# Patient Record
Sex: Male | Born: 1990 | Race: Black or African American | Hispanic: No | Marital: Single | Smoking: Never smoker
Health system: Southern US, Community
[De-identification: ages and names within clinical notes are randomized; demographics above are authoritative.]

---

## 2015-07-14 ENCOUNTER — Encounter (HOSPITAL_COMMUNITY): Payer: Self-pay | Admitting: Emergency Medicine

## 2015-07-14 ENCOUNTER — Emergency Department (INDEPENDENT_AMBULATORY_CARE_PROVIDER_SITE_OTHER)
Admission: EM | Admit: 2015-07-14 | Discharge: 2015-07-14 | Disposition: A | Discharge status: Home / Self Care | Payer: Self-pay | Source: Home / Self Care | Attending: Family Medicine | Admitting: Family Medicine

## 2015-07-14 DIAGNOSIS — R000 Tachycardia, unspecified: Secondary | ICD-10-CM

## 2015-07-14 NOTE — ED Notes (Signed)
Patient attempted to donate plasma at [facility].  Patient was refused due to heart rate being elevated.  Instructed to have tachycardia evaluated.

## 2015-07-14 NOTE — Discharge Instructions (Signed)
No more caffeine or energy drinks, drink plenty of water or gatorade. See your doctor as needed.

## 2015-07-14 NOTE — ED Notes (Signed)
Dr Artis Flock completed paperwork for [facility]-1 page, copied to be scanned into medical record

## 2015-07-14 NOTE — ED Provider Notes (Signed)
CSN: [id number]     Arrival date & time 07/14/15  1508 History   First MD Initiated Contact with Patient 07/14/15 1532     Chief Complaint  Patient presents with  . Tachycardia   (Consider location/radiation/quality/duration/timing/severity/associated sxs/prior Treatment) Patient is a 24 y.o. male presenting with palpitations. The history is provided by the patient.  Palpitations Palpitations quality:  Fast Onset quality:  Gradual Timing:  Intermittent Progression:  Resolved Chronicity:  New Context: anxiety and caffeine   Context: not nicotine and not stimulant use   Context comment:  Found to have tach at [facility], pt reports anxiety. Associated symptoms: no chest pain and no shortness of breath     History reviewed. No pertinent past medical history. History reviewed. No pertinent past surgical history. No family history on file. Social History  Substance Use Topics  . Smoking status: Never Smoker   . Smokeless tobacco: None  . Alcohol Use: None    Review of Systems  Constitutional: Negative.   HENT: Negative.   Respiratory: Negative.  Negative for chest tightness and shortness of breath.   Cardiovascular: Positive for palpitations. Negative for chest pain and leg swelling.  Gastrointestinal: Negative.   Neurological: Negative.     Allergies  Review of patient's allergies indicates no known allergies.  Home Medications   Prior to Admission medications   Not on File   Meds Ordered and Administered this Visit  Medications - No data to display  BP 147/75 mmHg  Pulse 73  Temp(Src) 98.7 F (37.1 C) (Oral)  Resp 18  SpO2 99% No data found.   Physical Exam  Constitutional: He is oriented to person, place, and time. He appears well-developed and well-nourished. No distress.  Neck: Normal range of motion. Neck supple. No thyromegaly present.  Cardiovascular: Normal rate, normal heart sounds and intact distal pulses.   Pulmonary/Chest: Effort normal and  breath sounds normal.  Neurological: He is alert and oriented to person, place, and time.  Skin: Skin is warm and dry.  Nursing note and vitals reviewed.   ED Course  Procedures (including critical care time)  Labs Review Labs Reviewed - No data to display  Imaging Review No results found.   Visual Acuity Review  Right Eye Distance:   Left Eye Distance:   Bilateral Distance:    Right Eye Near:   Left Eye Near:    Bilateral Near:         MDM   1. Tachycardia        Linna Hoff, MD 07/15/15 2045

## 2016-12-19 ENCOUNTER — Other Ambulatory Visit: Payer: Self-pay

## 2016-12-19 ENCOUNTER — Encounter (HOSPITAL_COMMUNITY): Payer: Self-pay | Admitting: Emergency Medicine

## 2016-12-19 ENCOUNTER — Emergency Department (HOSPITAL_COMMUNITY)
Admission: EM | Admit: 2016-12-19 | Discharge: 2016-12-20 | Disposition: A | Discharge status: Home / Self Care | Payer: Self-pay | Attending: Emergency Medicine | Admitting: Emergency Medicine

## 2016-12-19 DIAGNOSIS — T50905A Adverse effect of unspecified drugs, medicaments and biological substances, initial encounter: Secondary | ICD-10-CM

## 2016-12-19 DIAGNOSIS — Y829 Unspecified medical devices associated with adverse incidents: Secondary | ICD-10-CM | POA: Insufficient documentation

## 2016-12-19 DIAGNOSIS — R079 Chest pain, unspecified: Secondary | ICD-10-CM | POA: Insufficient documentation

## 2016-12-19 DIAGNOSIS — T407X5A Adverse effect of cannabis (derivatives), initial encounter: Secondary | ICD-10-CM | POA: Insufficient documentation

## 2016-12-19 DIAGNOSIS — T887XXA Unspecified adverse effect of drug or medicament, initial encounter: Secondary | ICD-10-CM | POA: Insufficient documentation

## 2016-12-19 LAB — I-STAT TROPONIN, ED: TROPONIN I, POC: 0 ng/mL (ref 0.00–0.08)

## 2016-12-19 LAB — CBC
HCT: 49.5 % (ref 39.0–52.0)
Hemoglobin: 16.5 g/dL (ref 13.0–17.0)
MCH: 28.2 pg (ref 26.0–34.0)
MCHC: 33.3 g/dL (ref 30.0–36.0)
MCV: 84.5 fL (ref 78.0–100.0)
PLATELETS: 330 10*3/uL (ref 150–400)
RBC: 5.86 MIL/uL — AB (ref 4.22–5.81)
RDW: 13.3 % (ref 11.5–15.5)
WBC: 12.1 10*3/uL — AB (ref 4.0–10.5)

## 2016-12-19 NOTE — ED Notes (Signed)
Patient transported to X-ray 

## 2016-12-19 NOTE — ED Triage Notes (Signed)
Pt states, "I feel like I am having a brain seizure on the left side of my head and my chest."  States he had a weird feeling start 10 min after smoking marijuana 30-45 min ago.  Denies nausea/vomiting.  Also reports sob.

## 2016-12-20 ENCOUNTER — Emergency Department (HOSPITAL_COMMUNITY): Payer: Self-pay

## 2016-12-20 LAB — BASIC METABOLIC PANEL
ANION GAP: 15 (ref 5–15)
BUN: 9 mg/dL (ref 6–20)
CHLORIDE: 97 mmol/L — AB (ref 101–111)
CO2: 27 mmol/L (ref 22–32)
Calcium: 9.8 mg/dL (ref 8.9–10.3)
Creatinine, Ser: 1.35 mg/dL — ABNORMAL HIGH (ref 0.61–1.24)
GFR calc non Af Amer: >60 mL/min (ref >60)
GLUCOSE: 146 mg/dL — AB (ref 65–99)
Potassium: 3.2 mmol/L — ABNORMAL LOW (ref 3.5–5.1)
Sodium: 139 mmol/L (ref 135–145)

## 2016-12-20 MED ORDER — SODIUM CHLORIDE 0.9 % IV BOLUS (SEPSIS)
1000.0000 mL | Freq: Once | INTRAVENOUS | Status: AC
  Administered 2016-12-20: 1000 mL via INTRAVENOUS

## 2016-12-20 MED ORDER — POTASSIUM CHLORIDE CRYS ER 20 MEQ PO TBCR
40.0000 meq | EXTENDED_RELEASE_TABLET | Freq: Once | ORAL | Status: AC
  Administered 2016-12-20: 40 meq via ORAL
  Filled 2016-12-20: qty 2

## 2016-12-20 NOTE — ED Provider Notes (Signed)
[facility] Provider Note   CSN: [id number] Arrival date & time: 12/19/16  2331     History   Chief Complaint Chief Complaint  Patient presents with  . chest pain after smoking marijuana    HPI Kenneth Gill is a 26 y.o. male.  Patient with no significant medical history, nonsmoker, presents for evaluation of adverse reaction after smoking marijuana approximately one hour prior to arrival. He reports ringing in his ears but not hearing voices or having auditory hallucinations of any king. No visual hallucinations. No nausea, vomiting, SOB or cough. He was in his usual state of health prior to smoking. He states that he smoked from the same bag yesterday and did not have symptoms. He denies chest pain or tightness. No other drug use.    The history is provided by the patient. No language interpreter was used.    History reviewed. No pertinent past medical history.  There are no active problems to display for this patient.   History reviewed. No pertinent surgical history.     Home Medications    Prior to Admission medications   Medication Sig Start Date End Date Taking? Authorizing Provider  Multiple Vitamin (MULTIVITAMIN WITH MINERALS) TABS tablet Take 1 tablet by mouth daily.   Yes Historical Provider, MD    Family History No family history on file.  Social History Social History  Substance Use Topics  . Smoking status: Never Smoker  . Smokeless tobacco: Never Used  . Alcohol use Yes     Allergies   Patient has no known allergies.   Review of Systems Review of Systems  Constitutional: Negative for chills and fever.  HENT: Positive for tinnitus. Negative for trouble swallowing.   Eyes: Negative for visual disturbance.  Respiratory: Negative.  Negative for chest tightness and shortness of breath.   Cardiovascular: Negative.  Negative for chest pain.  Gastrointestinal: Negative.  Negative for abdominal pain, nausea and vomiting.    Musculoskeletal: Negative.  Negative for myalgias.  Skin: Negative.   Neurological: Positive for weakness. Negative for syncope, speech difficulty, light-headedness and headaches.  Psychiatric/Behavioral: Negative for confusion.     Physical Exam Updated Vital Signs BP 162/91 (BP Location: Right Arm)   Pulse 105   Resp 22   SpO2 99%   Physical Exam  Constitutional: He is oriented to person, place, and time. He appears well-developed and well-nourished.  HENT:  Head: Normocephalic and atraumatic.  Eyes: EOM are normal. Pupils are equal, round, and reactive to light.  Neck: Normal range of motion.  Cardiovascular: Normal rate and regular rhythm.   No murmur heard. Pulmonary/Chest: Effort normal and breath sounds normal. He has no wheezes. He has no rales.  Abdominal: Soft. There is no tenderness.  Musculoskeletal: Normal range of motion.  Neurological: He is alert and oriented to person, place, and time. He has normal strength and normal reflexes. No sensory deficit. He displays a negative Romberg sign.  CN's 3-12 intact. Speech clear, focused and oriented. No lateralizing weakness, sensory discrepancies. No deficits of coordination.     ED Treatments / Results  Labs (all labs ordered are listed, but only abnormal results are displayed) Labs Reviewed  BASIC METABOLIC PANEL - Abnormal; Notable for the following:       Result Value   Potassium 3.2 (*)    Chloride 97 (*)    Glucose, Bld 146 (*)    Creatinine, Ser 1.35 (*)    All other components within normal limits  CBC -  Abnormal; Notable for the following:    WBC 12.1 (*)    RBC 5.86 (*)    All other components within normal limits  I-STAT TROPOININ, ED    EKG  EKG Interpretation None       Radiology Dg Chest 2 View  Result Date: 12/20/2016 CLINICAL DATA:  Chest pain. EXAM: CHEST  2 VIEW COMPARISON:  None. FINDINGS: The cardiomediastinal contours are normal. The lungs are clear. Pulmonary vasculature is  normal. No consolidation, pleural effusion, or pneumothorax. No acute osseous abnormalities are seen. IMPRESSION: No acute abnormality. Electronically Signed   By: Rubye OaksMelanie  Ehinger M.D.   On: 12/20/2016 00:19    Procedures Procedures (including critical care time)  Medications Ordered in ED Medications  sodium chloride 0.9 % bolus 1,000 mL (not administered)     Initial Impression / Assessment and Plan / ED Course  I have reviewed the triage vital signs and the nursing notes.  Pertinent labs & imaging results that were available during my care of the patient were reviewed by me and considered in my medical decision making (see chart for details).     Patient presents with symptoms after smoking marijuana this evening. He feels as if it was laced with something, however, reports smoking from the same bag yesterday without problem. Some mild elevation to Cr, mild hypokalemia. 1 L fluids provided. VSS. Patient reassured exam is normal. He can be discharged home.    Final Clinical Impressions(s) / ED Diagnoses   Final diagnoses:  None   1. Drug reaction New Prescriptions New Prescriptions   No medications on file     Elpidio AnisShari Trevaughn Schear, PA-C 12/20/16 0217    Melene Planan Floyd, DO 12/20/16 0231

## 2016-12-20 NOTE — Discharge Instructions (Signed)
FOLLOW UP WITH A PRIMARY CARE PHYSICIAN OF YOUR CHOICE FOR ROUTINE MEDICAL CONCERNS. RETURN TO THE EMERGENCY DEPARTMENT AS NEEDED FOR URGENT CONCERNS.

## 2018-07-17 IMAGING — DX DG CHEST 2V
2 series · 2 of 2 positions shown · non-contrast
Comparison: None.

CLINICAL DATA: Chest pain.

EXAM:
CHEST  2 VIEW

[chest pa]
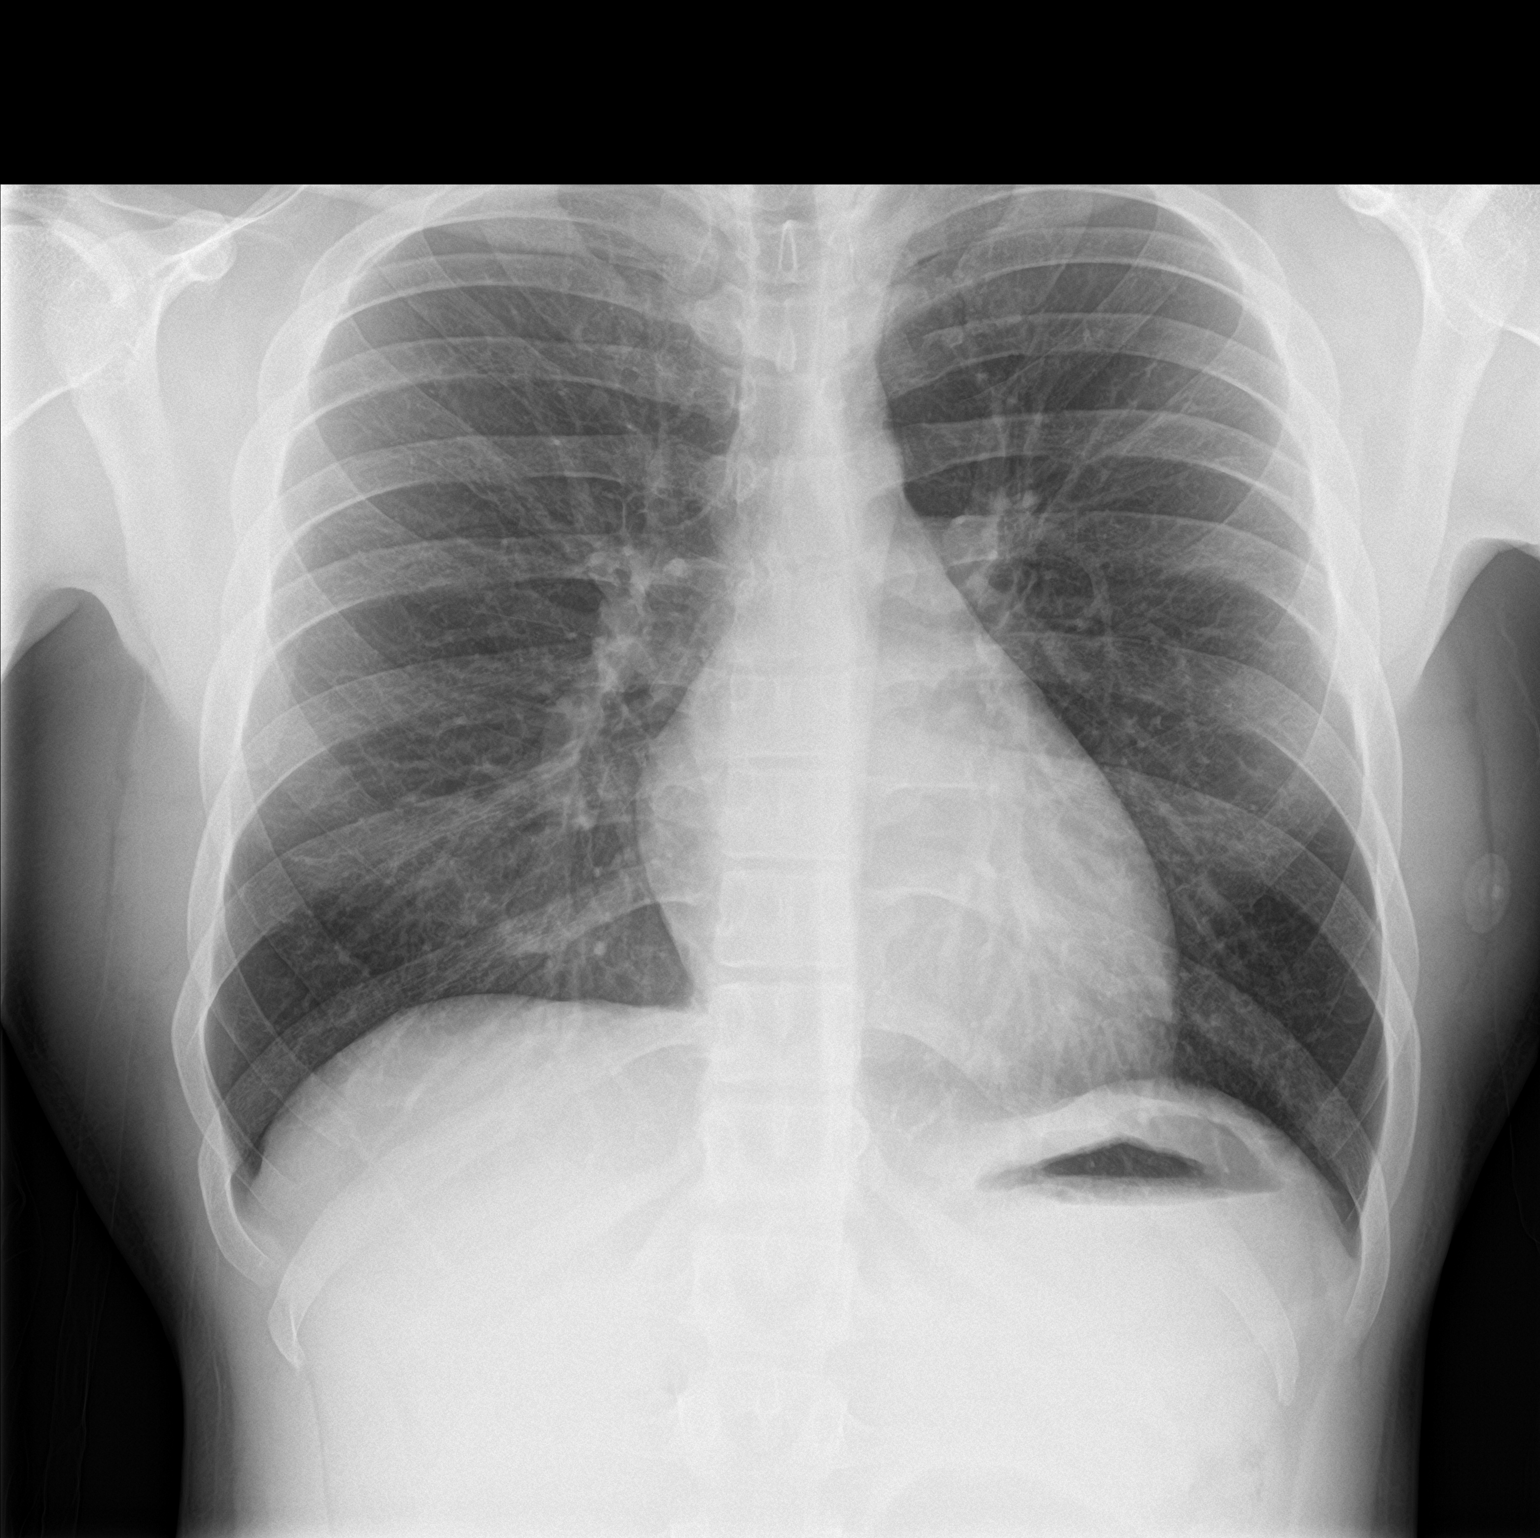

[chest lat]
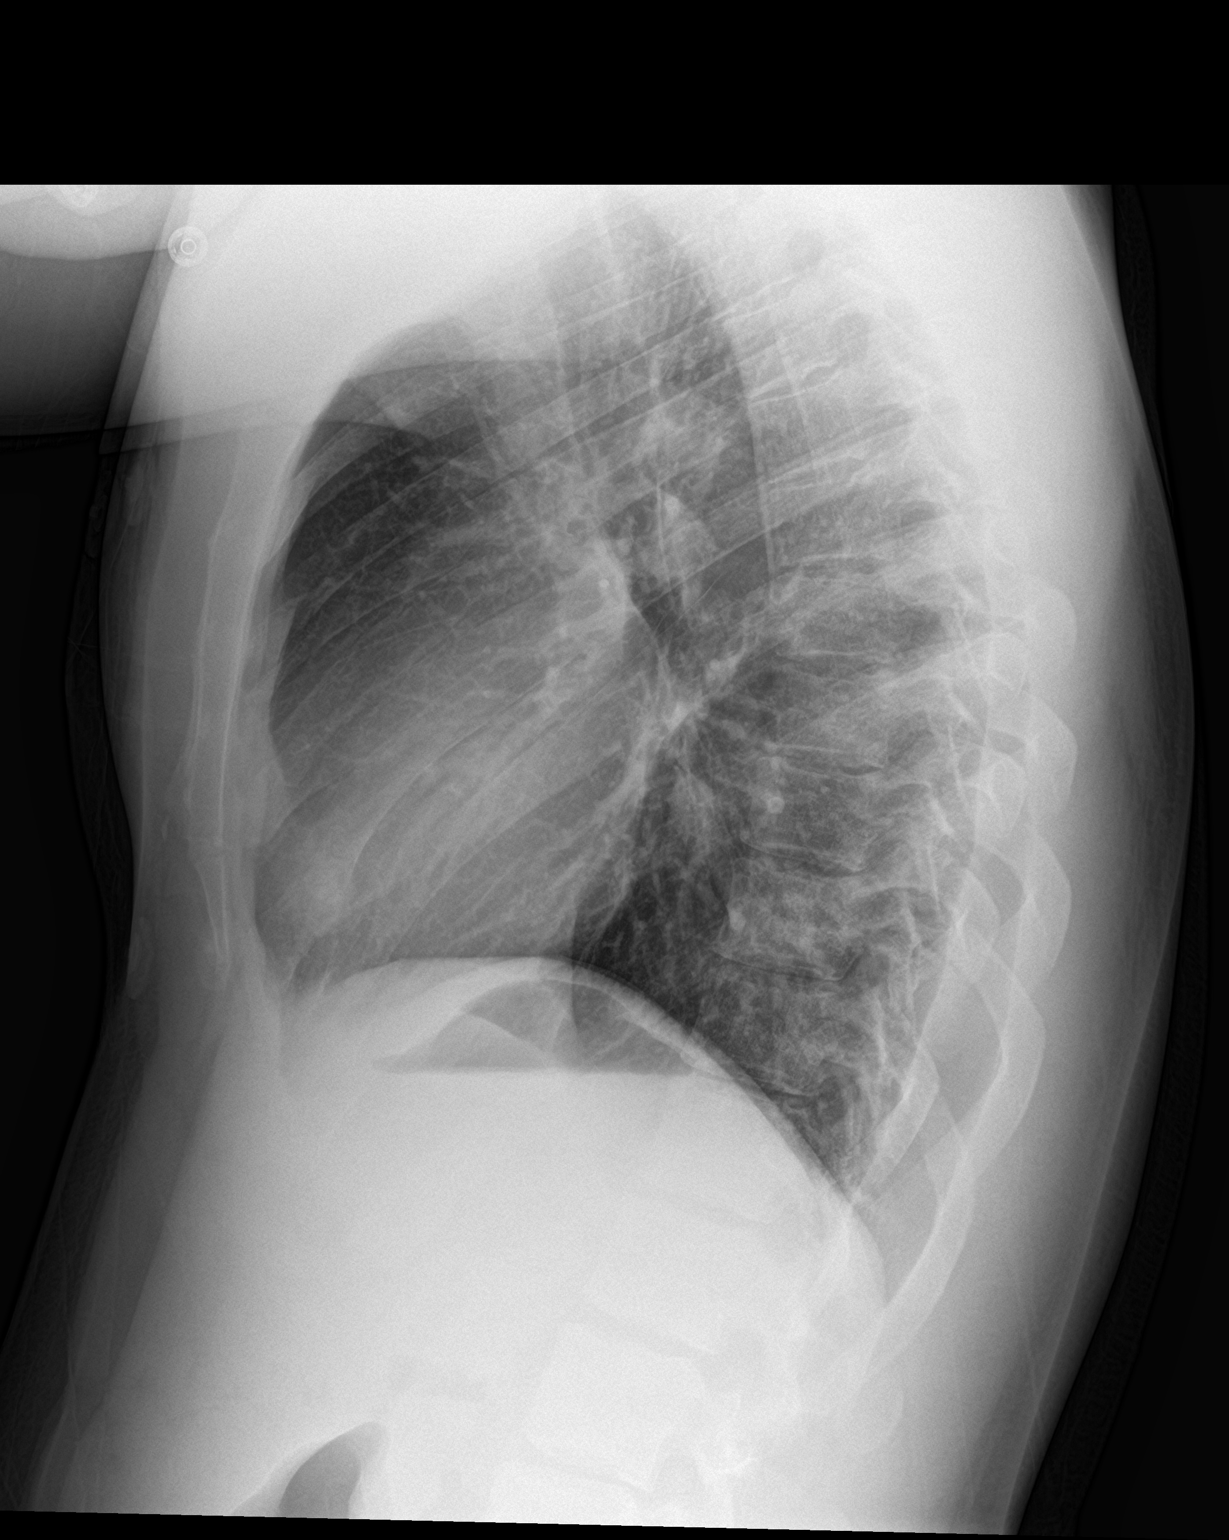

[2 of 2 positions shown; findings below may reference images not displayed]

FINDINGS: The cardiomediastinal contours are normal. The lungs are clear.
Pulmonary vasculature is normal. No consolidation, pleural effusion,
or pneumothorax. No acute osseous abnormalities are seen.
IMPRESSION: No acute abnormality.

## 2020-02-27 ENCOUNTER — Other Ambulatory Visit: Payer: Self-pay

## 2020-02-27 ENCOUNTER — Ambulatory Visit (HOSPITAL_COMMUNITY)
Admission: EM | Admit: 2020-02-27 | Discharge: 2020-02-27 | Disposition: A | Discharge status: Home / Self Care | Payer: Self-pay | Attending: Family Medicine | Admitting: Family Medicine

## 2020-02-27 ENCOUNTER — Encounter (HOSPITAL_COMMUNITY): Payer: Self-pay

## 2020-02-27 DIAGNOSIS — R030 Elevated blood-pressure reading, without diagnosis of hypertension: Secondary | ICD-10-CM | POA: Insufficient documentation

## 2020-02-27 DIAGNOSIS — N528 Other male erectile dysfunction: Secondary | ICD-10-CM | POA: Insufficient documentation

## 2020-02-27 LAB — CBC WITH DIFFERENTIAL/PLATELET
Abs Immature Granulocytes: 0.03 10*3/uL (ref 0.00–0.07)
Basophils Absolute: 0.1 10*3/uL (ref 0.0–0.1)
Basophils Relative: 1 %
Eosinophils Absolute: 0 10*3/uL (ref 0.0–0.5)
Eosinophils Relative: 0 %
HCT: 52.8 % — ABNORMAL HIGH (ref 39.0–52.0)
Hemoglobin: 17.2 g/dL — ABNORMAL HIGH (ref 13.0–17.0)
Immature Granulocytes: 0 %
Lymphocytes Relative: 20 %
Lymphs Abs: 1.9 10*3/uL (ref 0.7–4.0)
MCH: 27.7 pg (ref 26.0–34.0)
MCHC: 32.6 g/dL (ref 30.0–36.0)
MCV: 85 fL (ref 80.0–100.0)
Monocytes Absolute: 1 10*3/uL (ref 0.1–1.0)
Monocytes Relative: 10 %
Neutro Abs: 6.8 10*3/uL (ref 1.7–7.7)
Neutrophils Relative %: 69 %
Platelets: 367 10*3/uL (ref 150–400)
RBC: 6.21 MIL/uL — ABNORMAL HIGH (ref 4.22–5.81)
RDW: 12.5 % (ref 11.5–15.5)
WBC: 9.7 10*3/uL (ref 4.0–10.5)
nRBC: 0 % (ref 0.0–0.2)

## 2020-02-27 LAB — COMPREHENSIVE METABOLIC PANEL
ALT: 24 U/L (ref 0–44)
AST: 23 U/L (ref 15–41)
Albumin: 4.9 g/dL (ref 3.5–5.0)
Alkaline Phosphatase: 93 U/L (ref 38–126)
Anion gap: 10 (ref 5–15)
BUN: 8 mg/dL (ref 6–20)
CO2: 24 mmol/L (ref 22–32)
Calcium: 9.8 mg/dL (ref 8.9–10.3)
Chloride: 104 mmol/L (ref 98–111)
Creatinine, Ser: 0.98 mg/dL (ref 0.61–1.24)
GFR calc Af Amer: >60 mL/min (ref >60)
GFR calc non Af Amer: >60 mL/min (ref >60)
Glucose, Bld: 84 mg/dL (ref 70–99)
Potassium: 4.4 mmol/L (ref 3.5–5.1)
Sodium: 138 mmol/L (ref 135–145)
Total Bilirubin: 0.9 mg/dL (ref 0.3–1.2)
Total Protein: 8.4 g/dL — ABNORMAL HIGH (ref 6.5–8.1)

## 2020-02-27 LAB — TSH: TSH: 1.062 u[IU]/mL (ref 0.350–4.500)

## 2020-02-27 NOTE — Discharge Instructions (Addendum)
[facility] at [facility] [address] [place],  [place]  [address] [phone]  Follow up with primary care for testosterone labs. If all labs are ok, then we can better narrow down how to treat you.

## 2020-02-27 NOTE — ED Notes (Signed)
Patient called x 2 with no response. 

## 2020-02-27 NOTE — ED Triage Notes (Signed)
Patient reports he is struggling with ED and is requesting a prescription for Viagra.

## 2020-02-27 NOTE — ED Provider Notes (Signed)
[facility]    CSN: [id number] Arrival date & time: 02/27/20  1233      History   Chief Complaint Chief Complaint  Patient presents with  . Erectile Dysfunction    HPI Kenneth Gill is a 29 y.o. male.   Patient reports that he has been having trouble maintaining an erection and completing ejaculation with his male partner.  He reports that he has had this problem for years and it has never been treated.  Reports that he has no issue with oral sex or masturbation.  Denies sexual abuse history.  Denies hypertensive history, though blood pressure is elevated today.  Patient takes no daily medications.  Denies having a primary care physician.  Also reports that just before intercourse as he loses his erection, he begins to feel palpitations and sweat.  Denies that this is anxiety, discussed that it possibly could be.  Patient reports that he is extremely stressed, as this is causing a fall out of his current relationship.  Patient requesting prescription for Viagra.  Denies headache, cough, shortness of breath, nausea, vomiting, diarrhea, rash, fever, other symptoms.  ROS per HPI  The history is provided by the patient.    History reviewed. No pertinent past medical history.  There are no problems to display for this patient.   History reviewed. No pertinent surgical history.     Home Medications    Prior to Admission medications   Medication Sig Start Date End Date Taking? Authorizing Provider  Multiple Vitamin (MULTIVITAMIN WITH MINERALS) TABS tablet Take 1 tablet by mouth daily.    [provider]    Family History No family history on file.  Social History Social History   Tobacco Use  . Smoking status: Never Smoker  . Smokeless tobacco: Never Used  Substance Use Topics  . Alcohol use: Yes  . Drug use: Yes    Types: Marijuana     Allergies   Patient has no known allergies.   Review of Systems Review of Systems   Physical  Exam Triage Vital Signs ED Triage Vitals  Enc Vitals Group     BP 02/27/20 1413 (!) 146/95     Pulse Rate 02/27/20 1413 88     Resp 02/27/20 1413 16     Temp 02/27/20 1413 98.5 F (36.9 C)     Temp Source 02/27/20 1413 Oral     SpO2 02/27/20 1413 99 %     Weight --      Height --      Head Circumference --      Peak Flow --      Pain Score 02/27/20 1410 0     Pain Loc --      Pain Edu? --      Excl. in GC? --    No data found.  Updated Vital Signs BP (!) 146/95 (BP Location: Right Arm)   Pulse 88   Temp 98.5 F (36.9 C) (Oral)   Resp 16   SpO2 99%   Visual Acuity Right Eye Distance:   Left Eye Distance:   Bilateral Distance:    Right Eye Near:   Left Eye Near:    Bilateral Near:     Physical Exam Vitals and nursing note reviewed.  Constitutional:      General: He is not in acute distress.    Appearance: Normal appearance. He is well-developed and normal weight. He is not ill-appearing.  HENT:     Head: Normocephalic and  atraumatic.     Nose: Nose normal.     Mouth/Throat:     Mouth: Mucous membranes are moist.     Pharynx: Oropharynx is clear.  Eyes:     Conjunctiva/sclera: Conjunctivae normal.  Cardiovascular:     Rate and Rhythm: Normal rate and regular rhythm.     Heart sounds: Normal heart sounds. No murmur.  Pulmonary:     Effort: Pulmonary effort is normal. No respiratory distress.     Breath sounds: Normal breath sounds. No stridor. No wheezing, rhonchi or rales.  Chest:     Chest wall: No tenderness.  Abdominal:     General: Bowel sounds are normal. There is no distension.     Palpations: Abdomen is soft. There is no mass.     Tenderness: There is no abdominal tenderness. There is no right CVA tenderness, left CVA tenderness, guarding or rebound.     Hernia: No hernia is present.  Musculoskeletal:        General: Normal range of motion.     Cervical back: Normal range of motion and neck supple.  Skin:    General: Skin is warm and dry.      Capillary Refill: Capillary refill takes less than 2 seconds.  Neurological:     Mental Status: He is alert and oriented to person, place, and time.  Psychiatric:        Mood and Affect: Mood normal.        Behavior: Behavior normal.        Thought Content: Thought content normal.      UC Treatments / Results  Labs (all labs ordered are listed, but only abnormal results are displayed) Labs Reviewed  CBC WITH DIFFERENTIAL/PLATELET - Abnormal; Notable for the following components:      Result Value   RBC 6.21 (*)    Hemoglobin 17.2 (*)    HCT 52.8 (*)    All other components within normal limits  COMPREHENSIVE METABOLIC PANEL - Abnormal; Notable for the following components:   Total Protein 8.4 (*)    All other components within normal limits  TSH    EKG   Radiology No results found.  Procedures Procedures (including critical care time)  Medications Ordered in UC Medications - No data to display  Initial Impression / Assessment and Plan / UC Course  I have reviewed the triage vital signs and the nursing notes.  Pertinent labs & imaging results that were available during my care of the patient were reviewed by me and considered in my medical decision making (see chart for details).     Erectile dysfunction: Difficulty maintaining erection for years with male partners.  EKG in office sinus rhythm, no concern for acute coronary syndrome.  Blood pressure is elevated, patient attributes this to stress.  CBC, CMP, thyroid labs drawn in office today requesting that his testosterone be checked, we cannot do that here in urgent care.  Discussed that it may also be psychological, and that he may want to explore counseling.  Patient is not necessarily is open to this as he is medication.  Instructed patient to follow-up with PCP as needed.  Patient instructed to follow-up with the ED for trouble swallowing, trouble breathing, other concerning symptoms.  Patient verbalizes  understanding and agrees with treatment plan.  Information provided for cannot have primary care [facility] is there taking new patients right now. Final Clinical Impressions(s) / UC Diagnoses   Final diagnoses:  Other male erectile dysfunction  Elevated blood pressure reading     Discharge Instructions     [facility] at [facility] [address] [place],  [place]  [address] [phone]  Follow up with primary care for testosterone labs. If all labs are ok, then we can better narrow down how to treat you.    ED Prescriptions    None     PDMP not reviewed this encounter.   Moshe Cipro, NP 02/27/20 1801

## 2020-03-12 ENCOUNTER — Ambulatory Visit: Payer: Self-pay | Admitting: Internal Medicine

## 2020-03-21 ENCOUNTER — Other Ambulatory Visit: Payer: Self-pay

## 2020-03-21 ENCOUNTER — Ambulatory Visit (HOSPITAL_COMMUNITY)
Admission: EM | Admit: 2020-03-21 | Discharge: 2020-03-21 | Disposition: A | Discharge status: Home / Self Care | Payer: 59 | Attending: Urgent Care | Admitting: Urgent Care

## 2020-03-21 DIAGNOSIS — M545 Low back pain, unspecified: Secondary | ICD-10-CM

## 2020-03-21 DIAGNOSIS — M549 Dorsalgia, unspecified: Secondary | ICD-10-CM

## 2020-03-21 DIAGNOSIS — M62830 Muscle spasm of back: Secondary | ICD-10-CM

## 2020-03-21 DIAGNOSIS — M62838 Other muscle spasm: Secondary | ICD-10-CM

## 2020-03-21 MED ORDER — NAPROXEN 500 MG PO TABS: 500.0000 mg | ORAL_TABLET | Freq: Two times a day (BID) | ORAL | 0 refills | Status: DC

## 2020-03-21 MED ORDER — TIZANIDINE HCL 4 MG PO TABS: 4.0000 mg | ORAL_TABLET | Freq: Three times a day (TID) | ORAL | 0 refills | Status: DC | PRN

## 2020-03-21 NOTE — ED Triage Notes (Signed)
Pt c/o upper and lower back pain since Sunday

## 2020-03-21 NOTE — Discharge Instructions (Addendum)
Take naproxen for pain and inflammation. Use tizanidine as a muscle relaxant. Take it at bedtime if it makes you sleepy during the day. Hydrate well with at least 5-6 bottles of water daily (16 ounce bottles).

## 2020-03-21 NOTE — ED Provider Notes (Signed)
[facility]   MRN: [id number] DOB: 1991/02/15  Subjective:   Kenneth Gill is a 29 y.o. male presenting for 3 day hx of progressive upper and mid-low back pain. Sx can radiate from his low back into his buttocks. 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 Has tried rest, heating pad, no oral medications. Denies falls, trauma. Denies hematuria, dysuria. Walks normally. Tries to hydrate with 2-3 bottles of water daily.   No current facility-administered medications for this encounter.  Current Outpatient Medications:  Marland Kitchen  Multiple Vitamin (MULTIVITAMIN WITH MINERALS) TABS tablet, Take 1 tablet by mouth daily., Disp: , Rfl:    No Known Allergies  No past medical history on file.   No past surgical history on file.  No family history on file.  Social History   Tobacco Use  . Smoking status: Never Smoker  . Smokeless tobacco: Never Used  Substance Use Topics  . Alcohol use: Yes  . Drug use: Yes    Types: Marijuana    ROS   Objective:   Vitals: BP (!) 135/58   Pulse 86   Temp 97.8 F (36.6 C)   Resp 16   SpO2 99%   Physical Exam Constitutional:      General: He is not in acute distress.    Appearance: Normal appearance. He is well-developed and normal weight. He is not ill-appearing, toxic-appearing or diaphoretic.  HENT:     Head: Normocephalic and atraumatic.     Right Ear: External ear normal.     Left Ear: External ear normal.     Nose: Nose normal.     Mouth/Throat:     Pharynx: Oropharynx is clear.  Eyes:     General: No scleral icterus.       Right eye: No discharge.        Left eye: No discharge.     Extraocular Movements: Extraocular movements intact.     Pupils: Pupils are equal, round, and reactive to light.  Cardiovascular:     Rate and Rhythm: Normal rate.  Pulmonary:     Effort: Pulmonary effort is normal.  Musculoskeletal:     Cervical back: Normal range of motion. No swelling, edema, deformity,  erythema, signs of trauma, lacerations, rigidity, spasms, torticollis, tenderness or bony tenderness. Pain with movement (rotation to the right) present. Normal range of motion.     Thoracic back: No swelling, edema, deformity, signs of trauma, lacerations, spasms, tenderness or bony tenderness. Normal range of motion. No scoliosis.     Lumbar back: No swelling, edema, deformity, signs of trauma, lacerations, spasms, tenderness or bony tenderness. Normal range of motion. Negative right straight leg raise test and negative left straight leg raise test. No scoliosis.  Skin:    General: Skin is warm and dry.  Neurological:     Mental Status: He is alert and oriented to person, place, and time.     Motor: No weakness.     Coordination: Coordination normal.     Gait: Gait normal.     Deep Tendon Reflexes: Reflexes normal.  Psychiatric:        Mood and Affect: Mood normal.        Behavior: Behavior normal.        Thought Content: Thought content normal.        Judgment: Judgment normal.      Assessment and Plan :   PDMP not reviewed this encounter.  1. Upper  back pain   2. Acute bilateral low back pain without sciatica   3. Spasm of back muscles   4. Trapezius muscle spasm     Will manage conservatively for musculoskeletal type pain associated with the heavy lifting he had to do for his work over the weekend, lack of rest and no NSAID use.  Counseled on use of NSAID, muscle relaxant and modification of physical activity.  Anticipatory guidance provided.  Counseled patient on potential for adverse effects with medications prescribed/recommended today, ER and return-to-clinic precautions discussed, patient verbalized understanding.    Jaynee Eagles, PA-C 03/21/20 1230

## 2020-07-05 ENCOUNTER — Other Ambulatory Visit: Payer: Self-pay

## 2020-07-05 ENCOUNTER — Encounter (HOSPITAL_COMMUNITY): Payer: Self-pay | Admitting: Emergency Medicine

## 2020-07-05 ENCOUNTER — Ambulatory Visit (HOSPITAL_COMMUNITY)
Admission: EM | Admit: 2020-07-05 | Discharge: 2020-07-05 | Disposition: A | Discharge status: Home / Self Care | Payer: 59 | Attending: Family Medicine | Admitting: Family Medicine

## 2020-07-05 DIAGNOSIS — G4700 Insomnia, unspecified: Secondary | ICD-10-CM

## 2020-07-05 DIAGNOSIS — F329 Major depressive disorder, single episode, unspecified: Secondary | ICD-10-CM | POA: Diagnosis not present

## 2020-07-05 DIAGNOSIS — F32A Depression, unspecified: Secondary | ICD-10-CM

## 2020-07-05 MED ORDER — ESZOPICLONE 3 MG PO TABS: 3.0000 mg | ORAL_TABLET | Freq: Every day | ORAL | 0 refills | Status: DC

## 2020-07-05 MED ORDER — FLUOXETINE HCL 20 MG PO CAPS: 20.0000 mg | ORAL_CAPSULE | Freq: Every day | ORAL | 1 refills | Status: DC

## 2020-07-05 NOTE — ED Triage Notes (Signed)
Patient presents to [facility] for assessment of depression, suicidal ideation the three days prior to his arrival, and a near-syncopal episode approx 3 weeks ago while at work.  Patient states he is going through a breakup and has been feeling very sad.  C/o having suicidal thoughts with a plan to hurt himself, but has not taken steps to act on it.  Denies SI at this exact time.  MD made aware.

## 2020-07-16 NOTE — ED Provider Notes (Signed)
[facility]   [id number] 07/05/20 Arrival Time: 1543  ASSESSMENT & PLAN:  1. Depression, unspecified depression type   2. Insomnia, unspecified type     Begin: Meds ordered this encounter  Medications  . FLUoxetine (PROZAC) 20 MG capsule    Sig: Take 1 capsule (20 mg total) by mouth daily.    Dispense:  30 capsule    Refill:  1  . Eszopiclone (ESZOPICLONE) 3 MG TABS    Sig: Take 1 tablet (3 mg total) by mouth at bedtime. Take immediately before bedtime    Dispense:  7 tablet    Refill:  0    Recommend:  Follow-up Information    Schedule an appointment as soon as possible for a visit  with [facility].   Contact information: [address] [place] [address] [phone]       [facility] EMERGENCY DEPARTMENT.   Specialty: Emergency Medicine Why: If symptoms worsen in any way. Contact information: [address] [id number] mc [place] [address] [phone]             Makes verbal contract with me today to seek care should he have SI. Reviewed expectations re: course of current medical issues. Questions answered. Outlined signs and symptoms indicating need for more acute intervention. Patient verbalized understanding. After Visit Summary given.   SUBJECTIVE:  Kenneth Gill is a 29 y.o. male who presents with feelings of depression after recent relationship was ended. Passive suicidal thoughts this past week but without current thoughts/desire and without any plan. Having a hard time working/concentrating/sleeping for a few weeks. No new medications. No drug use reported. No heavy alcohol use reported.   Social History   Tobacco Use  Smoking Status Never Smoker  Smokeless Tobacco Never Used     OBJECTIVE:  Vitals:   07/05/20 1749  BP: 115/79  Pulse: 70  Resp: 18  Temp: 98.2 F (36.8 C)  TempSrc: Oral  SpO2: 98%    General appearance: alert; appears  NAD but sad Eyes: PERRLA; EOMI; conjunctiva normal HENT: normocephalic; atraumatic Skin: warm and dry Psychological: alert and cooperative; appropriate mood; normal affect   No Known Allergies  History reviewed. No pertinent past medical history. Social History   Socioeconomic History  . Marital status: Single    Spouse name: Not on file  . Number of children: Not on file  . Years of education: Not on file  . Highest education level: Not on file  Occupational History  . Not on file  Tobacco Use  . Smoking status: Never Smoker  . Smokeless tobacco: Never Used  Vaping Use  . Vaping Use: Never used  Substance and Sexual Activity  . Alcohol use: Yes  . Drug use: Yes    Types: Marijuana  . Sexual activity: Not on file  Other Topics Concern  . Not on file  Social History Narrative  . Not on file   Social Determinants of Health   Financial Resource Strain:   . Difficulty of Paying Living Expenses: Not on file  Food Insecurity:   . Worried About Programme researcher, broadcasting/film/video in the Last Year: Not on file  . Ran Out of Food in the Last Year: Not on file  Transportation Needs:   . Lack of Transportation (Medical): Not on file  . Lack of Transportation (Non-Medical): Not on file  Physical Activity:   . Days of Exercise per Week: Not on file  .  Minutes of Exercise per Session: Not on file  Stress:   . Feeling of Stress : Not on file  Social Connections:   . Frequency of Communication with Friends and Family: Not on file  . Frequency of Social Gatherings with Friends and Family: Not on file  . Attends Religious Services: Not on file  . Active Member of Clubs or Organizations: Not on file  . Attends Banker Meetings: Not on file  . Marital Status: Not on file  Intimate Partner Violence:   . Fear of Current or Ex-Partner: Not on file  . Emotionally Abused: Not on file  . Physically Abused: Not on file  . Sexually Abused: Not on file   History reviewed. No pertinent  family history. History reviewed. No pertinent surgical history.    Mardella Layman, MD 07/18/20 1004

## 2022-07-09 ENCOUNTER — Encounter (HOSPITAL_COMMUNITY): Payer: Self-pay

## 2022-07-09 ENCOUNTER — Ambulatory Visit (HOSPITAL_COMMUNITY)
Admission: EM | Admit: 2022-07-09 | Discharge: 2022-07-09 | Disposition: A | Discharge status: Home / Self Care | Payer: Self-pay | Attending: Internal Medicine | Admitting: Internal Medicine

## 2022-07-09 DIAGNOSIS — S29011A Strain of muscle and tendon of front wall of thorax, initial encounter: Secondary | ICD-10-CM

## 2022-07-09 MED ORDER — IBUPROFEN 800 MG PO TABS: 800.0000 mg | ORAL_TABLET | Freq: Three times a day (TID) | ORAL | 0 refills | Status: DC

## 2022-07-09 MED ORDER — ACETAMINOPHEN 500 MG PO TABS: 1000.0000 mg | ORAL_TABLET | Freq: Four times a day (QID) | ORAL | 0 refills | Status: DC | PRN

## 2022-07-09 MED ORDER — TIZANIDINE HCL 4 MG PO TABS: 4.0000 mg | ORAL_TABLET | Freq: Four times a day (QID) | ORAL | 0 refills | Status: DC | PRN

## 2022-07-09 MED ORDER — IBUPROFEN 800 MG PO TABS
ORAL_TABLET | ORAL | Status: AC
  Filled 2022-07-09: qty 1

## 2022-07-09 MED ORDER — IBUPROFEN 800 MG PO TABS
800.0000 mg | ORAL_TABLET | Freq: Once | ORAL | Status: AC
  Administered 2022-07-09: 800 mg via ORAL

## 2022-07-09 NOTE — Discharge Instructions (Signed)
You have been evaluated in the today for your right sided chest pain. Your pain is most likely muscle strain which will improve on its own with time.   Take ibuprofen 800mg   with food every 8 hours for the next 2-3 days consistently to treat pain and inflammation, then as needed. Do not take any other NSAID containing medicine when taking ibuprofen like naproxen , Advil, Aleve, motrin, aspirin, Voltaren, diclofenac, and other medicines. Take this medication with a snack to avoid stomach upset.   Your first dose of ibuprofen when you get home may be at 2am tomorrow morning since you were given ibuprofen in the clinic today.  You may also take zanaflex muscle relaxer every 6 hours.  Do not take this medication and drive or drink alcohol as it can make you sleepy.  Mainly use this medicine at nighttime as needed.  Apply heat and perform gentle range of motion exercises to the area of greatest pain to prevent muscle stiffness and provide further pain relief.   Follow-up with your primary care provider or return to urgent care if your symptoms do not improve in the next 3 to 4 days with medications and interventions recommended today.  If you develop any new or worsening symptoms, please return to urgent care.  If your symptoms are severe, please go to the emergency room.  I hope you feel better!

## 2022-07-09 NOTE — ED Triage Notes (Signed)
Pt c/o rt rib pain under breast x2 days. States pain and heaviness when taking a deep breath. Denies injury or cough. Too ibuprofen with no relief.

## 2022-07-09 NOTE — ED Provider Notes (Signed)
[facility]    CSN: [id number] Arrival date & time: 07/09/22  1705      History   Chief Complaint Chief Complaint  Patient presents with   Chest Pain    HPI Kenneth Gill is a 31 y.o. male.   Patient presents to urgent care for evaluation of right sided chest discomfort that is to the inferior right breast that started 2 days ago upon waking. Pain is worse when taking in a deep breath and does not radiate. Denies recent falls, injuries, or trauma to the chest. Patient lifts heavy objects at work, but cannot recall any recent injuries or incidents where he may have physically overexerted himself or been struck to the chest by any objects. Denies shortness of breath, nausea, vomiting, diaphoresis, history of rib fracture, fever/chills, cough, URI symptoms, and history of asthma. He smokes marijuana but denies use of cigarettes/other drug use. Pain is currently a 6 on a scale of 0-10 and is described as a sharp/stabbing sensation to the ribcage in one pinpoint location to the inferior right nipple. Patient took 600mg  ibuprofen yesterday which helped pain temporarily, but pain returned. No other triggering or relieving factors identified for symptoms.    Chest Pain   History reviewed. No pertinent past medical history.  There are no problems to display for this patient.   History reviewed. No pertinent surgical history.     Home Medications    Prior to Admission medications   Medication Sig Start Date End Date Taking? Authorizing Provider  acetaminophen (TYLENOL) 500 MG tablet Take 2 tablets (1,000 mg total) by mouth every 6 (six) hours as needed. 07/09/22  Yes 07/11/22, FNP  ibuprofen (ADVIL) 800 MG tablet Take 1 tablet (800 mg total) by mouth 3 (three) times daily. 07/09/22  Yes 07/11/22, FNP  tiZANidine (ZANAFLEX) 4 MG tablet Take 1 tablet (4 mg total) by mouth every 6 (six) hours as needed for muscle spasms. 07/09/22  Yes 07/11/22, FNP  Multiple Vitamin (MULTIVITAMIN WITH MINERALS) TABS tablet Take 1 tablet by mouth daily.    [provider]    Family History History reviewed. No pertinent family history.  Social History Social History   Tobacco Use   Smoking status: Never   Smokeless tobacco: Never  Vaping Use   Vaping Use: Never used  Substance Use Topics   Alcohol use: Yes   Drug use: Yes    Types: Marijuana     Allergies   Patient has no known allergies.   Review of Systems Review of Systems  Cardiovascular:  Positive for chest pain.  Per HPI   Physical Exam Triage Vital Signs ED Triage Vitals  Enc Vitals Group     BP 07/09/22 1736 (!) 150/83     Pulse Rate 07/09/22 1736 81     Resp 07/09/22 1736 18     Temp 07/09/22 1736 98 F (36.7 C)     Temp Source 07/09/22 1736 Oral     SpO2 07/09/22 1736 97 %     Weight --      Height --      Head Circumference --      Peak Flow --      Pain Score 07/09/22 1737 6     Pain Loc --      Pain Edu? --      Excl. in [place]? --    No data found.  Updated Vital Signs BP (!) 150/83 (BP Location:  Left Arm)   Pulse 81   Temp 98 F (36.7 C) (Oral)   Resp 18   SpO2 97%   Visual Acuity Right Eye Distance:   Left Eye Distance:   Bilateral Distance:    Right Eye Near:   Left Eye Near:    Bilateral Near:     Physical Exam Vitals and nursing note reviewed.  Constitutional:      Appearance: Normal appearance. He is not ill-appearing or toxic-appearing.     Comments: Very pleasant patient sitting on exam in position of comfort table in no acute distress.   HENT:     Head: Normocephalic and atraumatic.     Right Ear: Hearing and external ear normal.     Left Ear: Hearing and external ear normal.     Nose: Nose normal.     Mouth/Throat:     Lips: Pink.     Mouth: Mucous membranes are moist.  Eyes:     General: Lids are normal. Vision grossly intact. Gaze aligned appropriately.     Extraocular Movements: Extraocular  movements intact.     Conjunctiva/sclera: Conjunctivae normal.  Cardiovascular:     Rate and Rhythm: Normal rate and regular rhythm.     Heart sounds: Normal heart sounds, S1 normal and S2 normal.  Pulmonary:     Effort: Pulmonary effort is normal. No respiratory distress.     Breath sounds: Normal breath sounds and air entry.  Chest:       Comments: Pain to palpation of area outlined in red above. No palpable masses to the right breast tissue. No obvious deformity, ecchymosis, or evidence of trauma to the area. No rash, erythema, swelling, or warmth.  Abdominal:     Palpations: Abdomen is soft.  Musculoskeletal:     Cervical back: Neck supple.  Skin:    General: Skin is warm and dry.     Capillary Refill: Capillary refill takes less than 2 seconds.     Findings: No rash.  Neurological:     General: No focal deficit present.     Mental Status: He is alert and oriented to person, place, and time. Mental status is at baseline.     Cranial Nerves: No dysarthria or facial asymmetry.     Gait: Gait is intact.  Psychiatric:        Mood and Affect: Mood normal.        Speech: Speech normal.        Behavior: Behavior normal.        Thought Content: Thought content normal.        Judgment: Judgment normal.      UC Treatments / Results  Labs (all labs ordered are listed, but only abnormal results are displayed) Labs Reviewed - No data to display  EKG   Radiology No results found.  Procedures Procedures (including critical care time)  Medications Ordered in UC Medications  ibuprofen (ADVIL) tablet 800 mg (800 mg Oral Given 07/09/22 1813)    Initial Impression / Assessment and Plan / UC Course  I have reviewed the triage vital signs and the nursing notes.  Pertinent labs & imaging results that were available during my care of the patient were reviewed by me and considered in my medical decision making (see chart for details).   1. Muscle strain of chest wall Symptoms  and physical exam in the clinic today are consistent with musculoskeletal strain etiology of the chest wall.  Patient given 800 mg of ibuprofen in  clinic and advised to take this every 8 hours as needed at home for the next few days with food to treat inflammation and pain.  He may use this along with tizanidine muscle relaxer every 6 hours as needed for muscle spasm.  No drinking alcohol or driving while taking Zanaflex muscle relaxer due to possible drowsiness side effect.  Tylenol 1000 mg every 6 hours may also be used for further pain relief.  Heat and gentle range of motion exercises advised.  Low suspicion for cardiac cause for chest discomfort as it is reproducible with palpation of the chest wall and worsened by movement.  Deferred imaging based on stable cardiopulmonary exam and hemodynamically stable vital signs at time of encounter.  Patient agreeable with plan.    Discussed physical exam and available lab work findings in clinic with patient.  Counseled patient regarding appropriate use of medications and potential side effects for all medications recommended or prescribed today. Discussed red flag signs and symptoms of worsening condition,when to call the PCP office, return to urgent care, and when to seek higher level of care in the emergency department. Patient verbalizes understanding and agreement with plan. All questions answered. Patient discharged in stable condition.  Final Clinical Impressions(s) / UC Diagnoses   Final diagnoses:  Muscle strain of chest wall, initial encounter     Discharge Instructions      You have been evaluated in the today for your right sided chest pain. Your pain is most likely muscle strain which will improve on its own with time.   Take ibuprofen 800mg   with food every 8 hours for the next 2-3 days consistently to treat pain and inflammation, then as needed. Do not take any other NSAID containing medicine when taking ibuprofen like naproxen , Advil,  Aleve, motrin, aspirin, Voltaren, diclofenac, and other medicines. Take this medication with a snack to avoid stomach upset.   Your first dose of ibuprofen when you get home may be at 2am tomorrow morning since you were given ibuprofen in the clinic today.  You may also take zanaflex muscle relaxer every 6 hours.  Do not take this medication and drive or drink alcohol as it can make you sleepy.  Mainly use this medicine at nighttime as needed.  Apply heat and perform gentle range of motion exercises to the area of greatest pain to prevent muscle stiffness and provide further pain relief.   Follow-up with your primary care provider or return to urgent care if your symptoms do not improve in the next 3 to 4 days with medications and interventions recommended today.  If you develop any new or worsening symptoms, please return to urgent care.  If your symptoms are severe, please go to the emergency room.  I hope you feel better!    ED Prescriptions     Medication Sig Dispense Auth. Provider   tiZANidine (ZANAFLEX) 4 MG tablet Take 1 tablet (4 mg total) by mouth every 6 (six) hours as needed for muscle spasms. 30 tablet M, FNP   ibuprofen (ADVIL) 800 MG tablet Take 1 tablet (800 mg total) by mouth 3 (three) times daily. 21 tablet M M, FNP   acetaminophen (TYLENOL) 500 MG tablet Take 2 tablets (1,000 mg total) by mouth every 6 (six) hours as needed. 30 tablet M, FNP      PDMP not reviewed this encounter.   Carlisle Beers, Carlisle Beers 07/11/22 1701

## 2022-12-03 ENCOUNTER — Ambulatory Visit (INDEPENDENT_AMBULATORY_CARE_PROVIDER_SITE_OTHER): Payer: Self-pay

## 2022-12-03 ENCOUNTER — Ambulatory Visit (HOSPITAL_COMMUNITY)
Admission: EM | Admit: 2022-12-03 | Discharge: 2022-12-03 | Disposition: A | Discharge status: Home / Self Care | Payer: Self-pay | Attending: Emergency Medicine | Admitting: Emergency Medicine

## 2022-12-03 ENCOUNTER — Encounter (HOSPITAL_COMMUNITY): Payer: Self-pay

## 2022-12-03 DIAGNOSIS — M25461 Effusion, right knee: Secondary | ICD-10-CM

## 2022-12-03 DIAGNOSIS — M25561 Pain in right knee: Secondary | ICD-10-CM

## 2022-12-03 NOTE — Discharge Instructions (Addendum)
Rest - try to avoid heavy lifting and high impact activity Ice - apply for 20 minutes a few times daily Compression - use knee sleeve or ACE bandage as needed Elevation - prop up on a pillow  Ibuprofen up to 800 mg every 6 hours (3x daily) for the next 5-6 days  Please follow with the orthopedic specialists if needed.

## 2022-12-03 NOTE — ED Provider Notes (Signed)
[facility]    CSN: [id number] Arrival date & time: 12/03/22  1427      History   Chief Complaint Chief Complaint  Patient presents with   Knee Injury    HPI Kenneth Gill is a 32 y.o. male.  Here with 3 day history of right knee pain, swelling Reports hit the knee on something Has noticed fluid, swelling, reduced range of motion At first couldn't walk on it Pain has mildly improved No medications taken  Denies hx of injury  History reviewed. No pertinent past medical history.  There are no problems to display for this patient.   History reviewed. No pertinent surgical history.     Home Medications    Prior to Admission medications   Medication Sig Start Date End Date Taking? Authorizing Provider  acetaminophen (TYLENOL) 500 MG tablet Take 2 tablets (1,000 mg total) by mouth every 6 (six) hours as needed. 07/09/22   Talbot Grumbling, FNP  ibuprofen (ADVIL) 800 MG tablet Take 1 tablet (800 mg total) by mouth 3 (three) times daily. 07/09/22   Talbot Grumbling, FNP  Multiple Vitamin (MULTIVITAMIN WITH MINERALS) TABS tablet Take 1 tablet by mouth daily.    [provider]  tiZANidine (ZANAFLEX) 4 MG tablet Take 1 tablet (4 mg total) by mouth every 6 (six) hours as needed for muscle spasms. 07/09/22   Talbot Grumbling, FNP    Family History History reviewed. No pertinent family history.  Social History Social History   Tobacco Use   Smoking status: Never   Smokeless tobacco: Never  Vaping Use   Vaping Use: Never used  Substance Use Topics   Alcohol use: Yes   Drug use: Yes    Types: Marijuana     Allergies   Patient has no known allergies.   Review of Systems Review of Systems As per HPI  Physical Exam Triage Vital Signs ED Triage Vitals  Enc Vitals Group     BP 12/03/22 1510 129/80     Pulse Rate 12/03/22 1510 82     Resp 12/03/22 1510 16     Temp 12/03/22 1510 98.4 F (36.9 C)     Temp Source 12/03/22  1510 Oral     SpO2 12/03/22 1510 95 %     Weight 12/03/22 1509 250 lb (113.4 kg)     Height 12/03/22 1509 5\' 9"  (1.753 m)     Head Circumference --      Peak Flow --      Pain Score 12/03/22 1508 6     Pain Loc --      Pain Edu? --      Excl. in [place]? --    No data found.  Updated Vital Signs BP 129/80 (BP Location: Left Arm)   Pulse 82   Temp 98.4 F (36.9 C) (Oral)   Resp 16   Ht 5\' 9"  (1.753 m)   Wt 250 lb (113.4 kg)   SpO2 95%   BMI 36.92 kg/m    Physical Exam Vitals and nursing note reviewed.  Constitutional:      General: He is not in acute distress. HENT:     Mouth/Throat:     Pharynx: Oropharynx is clear.  Cardiovascular:     Rate and Rhythm: Normal rate and regular rhythm.     Pulses: Normal pulses.  Pulmonary:     Effort: Pulmonary effort is normal.  Musculoskeletal:        General: No swelling, tenderness  or deformity. Normal range of motion.     Comments: Full ROM of right knee, some discomfort with full extension. Non tender on palpation. A little swelling of medial knee, no warmth or redness. Pulses are good. Sensation intact  Skin:    General: Skin is warm and dry.     Findings: No bruising, erythema or rash.  Neurological:     Mental Status: He is alert and oriented to person, place, and time.     UC Treatments / Results  Labs (all labs ordered are listed, but only abnormal results are displayed) Labs Reviewed - No data to display  EKG   Radiology DG Knee AP/LAT W/Sunrise Right  Result Date: 12/03/2022 CLINICAL DATA:  Right knee pain after playing with kids in hitting knee EXAM: RIGHT KNEE 3 VIEWS COMPARISON:  None Available. FINDINGS: No acute fracture or dislocation. Small joint effusion. No evidence of arthropathy or other focal bone abnormality. Soft tissues are unremarkable. IMPRESSION: 1. No acute fracture or dislocation. 2. Small joint effusion. Electronically Signed   By: Beryle Flock M.D.   On: 12/03/2022 15:38     Procedures Procedures   Medications Ordered in UC Medications - No data to display  Initial Impression / Assessment and Plan / UC Course  I have reviewed the triage vital signs and the nursing notes.  Pertinent labs & imaging results that were available during my care of the patient were reviewed by me and considered in my medical decision making (see chart for details).  Xray negative for bony abnormality, small effusion noted Discussed RICE therapy, recommend ibuprofen for inflammation Provided ortho follow up as needed Work note given Return precautions discussed. Patient agrees to plan  Final Clinical Impressions(s) / UC Diagnoses   Final diagnoses:  Effusion of right knee     Discharge Instructions      Rest - try to avoid heavy lifting and high impact activity Ice - apply for 20 minutes a few times daily Compression - use knee sleeve or ACE bandage as needed Elevation - prop up on a pillow  Ibuprofen up to 800 mg every 6 hours (3x daily) for the next 5-6 days  Please follow with the orthopedic specialists if needed.    ED Prescriptions   None    PDMP not reviewed this encounter.   Les Pou, [place] 12/03/22 1635

## 2022-12-03 NOTE — ED Triage Notes (Signed)
Chief Complaint: Patient having right knee pain. Was playing with kids and the knee was hit. States there has been fluid build up, pain, and reduced range of motion since.   Onset: 3 days   Prescriptions or OTC medications tried: No

## 2024-06-20 ENCOUNTER — Encounter (HOSPITAL_COMMUNITY): Payer: Self-pay

## 2024-06-20 ENCOUNTER — Ambulatory Visit (HOSPITAL_COMMUNITY)
Admission: EM | Admit: 2024-06-20 | Discharge: 2024-06-20 | Disposition: A | Discharge status: Home / Self Care | Payer: Self-pay | Attending: Physician Assistant | Admitting: Physician Assistant

## 2024-06-20 DIAGNOSIS — N4889 Other specified disorders of penis: Secondary | ICD-10-CM | POA: Insufficient documentation

## 2024-06-20 LAB — POCT URINALYSIS DIP (MANUAL ENTRY)
Bilirubin, UA: NEGATIVE
Blood, UA: NEGATIVE
Glucose, UA: NEGATIVE mg/dL
Leukocytes, UA: NEGATIVE
Nitrite, UA: NEGATIVE
Protein Ur, POC: 30 mg/dL — AB
Spec Grav, UA: 1.02 (ref 1.010–1.025)
Urobilinogen, UA: 2 U/dL — AB
pH, UA: 7 (ref 5.0–8.0)

## 2024-06-20 NOTE — ED Notes (Signed)
 At bedside for provider examination of patient site of pain

## 2024-06-20 NOTE — ED Provider Notes (Signed)
 [facility]    CSN: [id number] Arrival date & time: 06/20/24  9041      History   Chief Complaint Chief Complaint  Patient presents with   penis issue    HPI Kenneth Gill is a 33 y.o. male.   Patient presents today with a 6-day history of intermittent pain in his penis.  He reports that symptoms began after a sexual encounter but he denied any pain during the encounter or pain with ejaculation.  He reports that if he has an erection he will occasionally have pain in a specific part of his glans that feels as though there is a point of pressure but not significant pain.  This does not happen with each erection but does occur in the same spot each time.  He denies any associated rash or skin lesion.  Denies any dysuria, frequency, urgency, penile discharge, genital lesion.  He does report that he used a blue chew before this encounter and does not know if this could have contributed to his symptoms but has used this supplement in the past without difficulty.  Denies any regular medication use or recent medication changes.  He denies any scrotal or testicular discomfort.  He has tried Azo as well as ibuprofen  without change in his symptoms.    History reviewed. No pertinent past medical history.  There are no active problems to display for this patient.   History reviewed. No pertinent surgical history.     Home Medications    Prior to Admission medications   Medication Sig Start Date End Date Taking? Authorizing Provider  Multiple Vitamin (MULTIVITAMIN WITH MINERALS) TABS tablet Take 1 tablet by mouth daily.    [provider]    Family History Family History  Problem Relation Age of Onset   Diabetes Mother    Hypertension Father     Social History Social History   Tobacco Use   Smoking status: Never   Smokeless tobacco: Never  Vaping Use   Vaping status: Never Used  Substance Use Topics   Alcohol use: Yes   Drug use: Yes    Types:  Marijuana     Allergies   Patient has no known allergies.   Review of Systems Review of Systems  Constitutional:  Negative for activity change, appetite change, fatigue and fever.  Gastrointestinal:  Negative for abdominal pain, diarrhea, nausea and vomiting.  Genitourinary:  Positive for penile pain. Negative for dysuria, frequency, genital sores, penile swelling, scrotal swelling, testicular pain and urgency.     Physical Exam Triage Vital Signs ED Triage Vitals  Encounter Vitals Group     BP 06/20/24 1048 126/76     Girls Systolic BP Percentile --      Girls Diastolic BP Percentile --      Boys Systolic BP Percentile --      Boys Diastolic BP Percentile --      Pulse Rate 06/20/24 1048 77     Resp 06/20/24 1048 16     Temp 06/20/24 1048 98.3 F (36.8 C)     Temp src --      SpO2 06/20/24 1048 98 %     Weight --      Height --      Head Circumference --      Peak Flow --      Pain Score 06/20/24 1049 0     Pain Loc --      Pain Education --  Exclude from Growth Chart --    No data found.  Updated Vital Signs BP 126/76 (BP Location: Right Arm)   Pulse 77   Temp 98.3 F (36.8 C)   Resp 16   SpO2 98%   Visual Acuity Right Eye Distance:   Left Eye Distance:   Bilateral Distance:    Right Eye Near:   Left Eye Near:    Bilateral Near:     Physical Exam Vitals reviewed.  Constitutional:      General: He is awake.     Appearance: Normal appearance. He is well-developed. He is not ill-appearing.     Comments: Very pleasant male appears stated age in no acute distress sitting comfortably in exam room  HENT:     Head: Normocephalic and atraumatic.  Cardiovascular:     Rate and Rhythm: Normal rate and regular rhythm.     Heart sounds: Normal heart sounds, S1 normal and S2 normal. No murmur heard. Pulmonary:     Effort: Pulmonary effort is normal.     Breath sounds: Normal breath sounds. No stridor. No wheezing, rhonchi or rales.     Comments: Clear  to auscultation bilaterally Abdominal:     General: Bowel sounds are normal.     Palpations: Abdomen is soft.     Tenderness: There is no abdominal tenderness.     Hernia: There is no hernia in the left inguinal area or right inguinal area.  Genitourinary:    Penis: Normal and circumcised. No tenderness, discharge, swelling or lesions.      Testes:        Right: Mass, tenderness or swelling not present.        Left: Mass, tenderness or swelling not present.     Comments: Luke, RN present as chaperone during exam Neurological:     Mental Status: He is alert.  Psychiatric:        Behavior: Behavior is cooperative.      UC Treatments / Results  Labs (all labs ordered are listed, but only abnormal results are displayed) Labs Reviewed  POCT URINALYSIS DIP (MANUAL ENTRY) - Abnormal; Notable for the following components:      Result Value   Ketones, POC UA trace (5) (*)    Protein Ur, POC =30 (*)    Urobilinogen, UA 2.0 (*)    All other components within normal limits  URINE CULTURE  CYTOLOGY, (ORAL, ANAL, URETHRAL) ANCILLARY ONLY    EKG   Radiology No results found.  Procedures Procedures (including critical care time)  Medications Ordered in UC Medications - No data to display  Initial Impression / Assessment and Plan / UC Course  I have reviewed the triage vital signs and the nursing notes.  Pertinent labs & imaging results that were available during my care of the patient were reviewed by me and considered in my medical decision making (see chart for details).     Patient is well-appearing, afebrile, nontoxic, nontachycardic.  Exam is normal with no obvious dermatologic or anatomical abnormalities.  UA was obtained that showed decreased oral intake but no evidence of acute infection.  Will send this for culture but defer antibiotics for the time being.  STI swab was also collected.  Recommended that he push fluids and can use over-the-counter medications for symptom  management.  Discussed that if he continues to have ongoing pain he should follow-up with urologist for further evaluation and management was given the contact information for a local provider with instruction to  call to schedule an appointment.  If he has any worsening or changing symptoms including persistent pain, rash, penile discharge, genital lesion, fever he should return immediately for reevaluation.  Strict return precautions given and all questions were answered to patient satisfaction.  Final Clinical Impressions(s) / UC Diagnoses   Final diagnoses:  Pain in the penis     Discharge Instructions      Your urine did not have any evidence of infection.  We will send this for culture just to make sure and contact you if we need to start antibiotics but wait until we have these results before we begin the medicines.  I would continue the over-the-counter medications to help with discomfort.  I also recommend following up with urology; call to schedule an appointment.  If anything worsens and you develop any kind of rash, oral lesions, penile discharge, urinary symptoms, fever please return for reevaluation immediately.     ED Prescriptions   None    PDMP not reviewed this encounter.   Sherrell Rocky POUR, PA-C 06/20/24 1215

## 2024-06-20 NOTE — ED Notes (Signed)
 Patient requested water to help provide a urine specimen

## 2024-06-20 NOTE — ED Triage Notes (Signed)
 Patient reports that he was having sex with his partner 6 days ago and he had a sharp in the head of his penis and every time he has an erection he has pain like a pencil stabbing in him. Patient denies penile discharge.  Patient states he has been taking Ibuprofen  and ice for his discomfort. Patient states he took Azo x 1 dose. Patient denies dysuria or urinary frequency.

## 2024-06-20 NOTE — ED Notes (Signed)
Urine placed in lab 

## 2024-06-20 NOTE — Discharge Instructions (Signed)
 Your urine did not have any evidence of infection.  We will send this for culture just to make sure and contact you if we need to start antibiotics but wait until we have these results before we begin the medicines.  I would continue the over-the-counter medications to help with discomfort.  I also recommend following up with urology; call to schedule an appointment.  If anything worsens and you develop any kind of rash, oral lesions, penile discharge, urinary symptoms, fever please return for reevaluation immediately.

## 2024-06-21 ENCOUNTER — Ambulatory Visit (HOSPITAL_COMMUNITY): Payer: Self-pay

## 2024-06-21 LAB — CYTOLOGY, (ORAL, ANAL, URETHRAL) ANCILLARY ONLY
Chlamydia: NEGATIVE
Comment: NEGATIVE
Comment: NEGATIVE
Comment: NORMAL
Neisseria Gonorrhea: NEGATIVE
Trichomonas: NEGATIVE

## 2024-06-21 LAB — URINE CULTURE: Culture: NO GROWTH
